# Patient Record
Sex: Female | Born: 1955 | Race: White | Hispanic: No | Marital: Married | Smoking: Former smoker
Health system: Southern US, Community
[De-identification: ages and names within clinical notes are randomized; demographics above are authoritative.]

## PROBLEM LIST (undated history)

## (undated) DIAGNOSIS — I10 Essential (primary) hypertension: Secondary | ICD-10-CM

## (undated) DIAGNOSIS — E119 Type 2 diabetes mellitus without complications: Secondary | ICD-10-CM

## (undated) HISTORY — PX: VASCULAR SURGERY: SHX849

## (undated) HISTORY — PX: MENISCUS REPAIR: SHX5179

## (undated) HISTORY — PX: CARDIAC SURGERY: SHX584

## (undated) HISTORY — PX: KNEE SURGERY: SHX244

---

## 2015-12-02 ENCOUNTER — Encounter: Payer: Self-pay | Admitting: Emergency Medicine

## 2015-12-02 ENCOUNTER — Emergency Department
Admission: EM | Admit: 2015-12-02 | Discharge: 2015-12-02 | Disposition: A | Discharge status: Home / Self Care | Payer: Self-pay | Attending: Emergency Medicine | Admitting: Emergency Medicine

## 2015-12-02 DIAGNOSIS — I10 Essential (primary) hypertension: Secondary | ICD-10-CM | POA: Insufficient documentation

## 2015-12-02 DIAGNOSIS — I471 Supraventricular tachycardia: Secondary | ICD-10-CM | POA: Insufficient documentation

## 2015-12-02 DIAGNOSIS — F17200 Nicotine dependence, unspecified, uncomplicated: Secondary | ICD-10-CM | POA: Insufficient documentation

## 2015-12-02 DIAGNOSIS — E119 Type 2 diabetes mellitus without complications: Secondary | ICD-10-CM | POA: Insufficient documentation

## 2015-12-02 HISTORY — DX: Essential (primary) hypertension: I10

## 2015-12-02 HISTORY — DX: Type 2 diabetes mellitus without complications: E11.9

## 2015-12-02 LAB — CBC WITH DIFFERENTIAL/PLATELET
BASOS ABS: 0 10*3/uL (ref 0–0.1)
Basophils Relative: 0 %
EOS ABS: 0.1 10*3/uL (ref 0–0.7)
EOS PCT: 1 %
HCT: 40.5 % (ref 35.0–47.0)
HEMOGLOBIN: 13.6 g/dL (ref 12.0–16.0)
LYMPHS ABS: 1.9 10*3/uL (ref 1.0–3.6)
LYMPHS PCT: 22 %
MCH: 26.3 pg (ref 26.0–34.0)
MCHC: 33.5 g/dL (ref 32.0–36.0)
MCV: 78.5 fL — AB (ref 80.0–100.0)
Monocytes Absolute: 0.6 10*3/uL (ref 0.2–0.9)
Monocytes Relative: 6 %
NEUTROS PCT: 71 %
Neutro Abs: 6.1 10*3/uL (ref 1.4–6.5)
PLATELETS: 327 10*3/uL (ref 150–440)
RBC: 5.15 MIL/uL (ref 3.80–5.20)
RDW: 15.9 % — ABNORMAL HIGH (ref 11.5–14.5)
WBC: 8.8 10*3/uL (ref 3.6–11.0)

## 2015-12-02 LAB — MAGNESIUM: MAGNESIUM: 1.5 mg/dL — AB (ref 1.7–2.4)

## 2015-12-02 LAB — BASIC METABOLIC PANEL
Anion gap: 13 (ref 5–15)
BUN: 9 mg/dL (ref 6–20)
CHLORIDE: 104 mmol/L (ref 101–111)
CO2: 24 mmol/L (ref 22–32)
Calcium: 9.5 mg/dL (ref 8.9–10.3)
Creatinine, Ser: 0.52 mg/dL (ref 0.44–1.00)
GFR calc Af Amer: >60 mL/min (ref >60)
Glucose, Bld: 201 mg/dL — ABNORMAL HIGH (ref 65–99)
POTASSIUM: 3.6 mmol/L (ref 3.5–5.1)
Sodium: 141 mmol/L (ref 135–145)

## 2015-12-02 MED ORDER — MAGNESIUM 400 MG PO TABS: 1.0000 | ORAL_TABLET | Freq: Three times a day (TID) | ORAL | Status: AC

## 2015-12-02 MED ORDER — SODIUM CHLORIDE 0.9 % IV BOLUS (SEPSIS): 1000.0000 mL | Freq: Once | INTRAVENOUS | Status: DC

## 2015-12-02 MED ORDER — MAGNESIUM SULFATE 2 GM/50ML IV SOLN
2.0000 g | Freq: Once | INTRAVENOUS | Status: AC
  Administered 2015-12-02: 2 g via INTRAVENOUS
  Filled 2015-12-02: qty 50

## 2015-12-02 NOTE — ED Notes (Signed)
AAOx3.  Skin warm and dry. NAD.  Ambulates with easy and steady gait.   

## 2015-12-02 NOTE — Discharge Instructions (Signed)
Paroxysmal Supraventricular Tachycardia Paroxysmal supraventricular tachycardia (PSVT) is a type of abnormal heart rhythm. It causes your heart to beat very quickly and then suddenly stop beating so quickly. A normal heart rate is 60-100 beats per minute. During an episode of PSVT, your heart rate may be 150-250 beats per minute. This can make you feel light-headed and short of breath. An episode of PSVT can be frightening. It is usually not dangerous. The heart has four chambers. All chambers need to work together for the heart to beat effectively. A normal heartbeat usually starts in the right upper chamber of the heart (atrium) when an area (sinoatrial node) puts out an electrical signal that spreads to the other chambers. People with PSVT may have abnormal electrical pathways, or they may have other areas in the upper chambers that send out electrical signals. The result is a very rapid heartbeat. When your heart beats very quickly, it does not have time to fill completely with blood. When PSVT happens often or it lasts for long periods, it can lead to heart weakness and failure. Most people with PSVT do not have any other heart disease. CAUSES Abnormal electrical activity in the heart causes PSVT. It is not known why some people get PSVT and others do not. RISK FACTORS You may be more likely to have PSVT if:  You are 20-30 years old.  You are a woman. Other factors that may increase your chances of an attack include:  Stress.  Being tired.  Smoking.  Stimulant drugs.  Alcoholic drinks.  Caffeine.  Pregnancy. SIGNS AND SYMPTOMS A mild episode of PSVT may cause no symptoms. If you do have signs and symptoms, they may include:  A pounding heart.  Feeling of skipped heartbeats (palpitations).  Weakness.  Shortness of breath.  Tightness or pain in your chest.  Light-headedness.  Anxiety.  Dizziness.  Sweating.  Nausea.  A fainting spell. DIAGNOSIS Your health care  provider may suspect PSVT if you have symptoms that come and go. The health care provider will do a physical exam. If you are having an episode during the exam, the health care provider may be able to diagnose PSVT by listening to your heart and feeling your pulse. Tests may also be done, including:  An electrical study of your heart (electrocardiogram, or ECG).  A test in which you wear a portable ECG monitor all day (Holter monitor) or for several days (event monitor).  A test that involves taking an image of your heart using sound waves (echocardiogram) to rule out other causes of a fast heart rate. TREATMENT You may not need treatment if episodes of PSVT do not happen often or if they do not cause symptoms. If PSVT episodes do cause symptoms, your health care provider may first suggest trying a self-treatment called vagus nerve stimulation. The vagus nerve extends down from the brain. It regulates certain body functions. Stimulating this nerve can slow down the heart. Your health care provider can teach you ways to do this. You may need to try a few ways to find what works best for you. Options include:  Holding your breath and pushing, as though you are having a bowel movement.  Massaging an area on one side of your neck below your jaw.  Bending forward with your head between your legs.  Bending forward with your head between your legs and coughing.  Massaging your eyeballs with your eyes closed. If vagus nerve stimulation does not work, other treatment options include:    Medicines to prevent an attack.  Being treated in the hospital with medicine or electric shock to stop an attack (cardioversion). This treatment can include:  Getting medicine through an IV line.  Having a small electric shock delivered to your heart. You will be given medicine to make you sleep through this procedure.  If you have frequent episodes with symptoms, you may need a procedure to get rid of the faulty  areas of your heart (radiofrequency ablation) and end the episodes of PSVT. In this procedure:  A long, thin tube (catheter) is passed through one of your veins into your heart.  Energy directed through the catheter eliminates the areas of your heart that are causing abnormal electric stimulation. HOME CARE INSTRUCTIONS  Take medicines only as directed by your health care provider.  Do not use caffeine in any form if caffeine triggers episodes of PSVT. Otherwise, consume caffeine in moderation. This means no more than a few cups of coffee or the equivalent each day.  Do not drink alcohol if alcohol triggers episodes of PSVT. Otherwise, limit alcohol intake to no more than 1 drink per day for nonpregnant women and 2 drinks per day for men. One drink equals 12 ounces of beer, 5 ounces of wine, or 1 ounces of hard liquor.  Do not use any tobacco products, including cigarettes, chewing tobacco, or electronic cigarettes. If you need help quitting, ask your health care provider.  Try to get at least 7 hours of sleep each night.  Find healthy ways to manage stress.  Perform vagus nerve stimulation as directed by your health care provider.  Maintain a healthy weight.  Get some exercise on most days. Ask your health care provider to suggest some good activities for you. SEEK MEDICAL CARE IF:  You are having episodes of PSVT more often, or they are lasting longer.  Vagus nerve stimulation is no longer helping.  You have new symptoms during an episode. SEEK IMMEDIATE MEDICAL CARE IF:  You have chest pain or trouble breathing.  You have an episode of PSVT that has lasted longer than 20 minutes.  You have passed out from an episode of PSVT. These symptoms may represent a serious problem that is an emergency. Do not wait to see if the symptoms will go away. Get medical help right away. Call your local emergency services (911 in the [place] Do not drive yourself to the hospital.   This  information is not intended to replace advice given to you by your health care provider. Make sure you discuss any questions you have with your health care provider.   Document Released: 06/08/2005 Document Revised: 06/29/2014 Document Reviewed: 11/16/2013 Elsevier Interactive Patient Education 2016 [employer]  

## 2015-12-02 NOTE — ED Notes (Signed)
Pt via ems with tachycardia. Pt states that she has had previous episodes of svt x 4 and that she always converts with "a shot." Pt is here from out of town and will see her cardiologist when she returns. Last episode of SVT was 6 months ago. Pt alert & oriented with NAD noted, asking to go home.

## 2015-12-02 NOTE — ED Provider Notes (Signed)
[facility] Emergency Department Provider Note  ____________________________________________  Time seen: 10:35 AM  I have reviewed the triage vital signs and the nursing notes.   HISTORY  Chief Complaint Tachycardia    HPI Victoria Potts is a 60 y.o. female brought to the ED for her tachycardia. EMS found that she was in SVT which has happened to her multiple times before. They gave her 6 mg of adenosine which immediately converted her to a sinus tachycardia with a rate of about 110. During the SVT with a heart rate of 180 she had felt some mild shortness of breath which resolved immediately upon comparing the sinus tachycardia. Denies any exertional symptoms. Onset was while folding clothes this morning. [place] to [place] and actually flying home to [place] this evening. She plans to see her cardiologist there this week given today's episode. She takes diltiazem daily which she took this morning as usual. She's not been missing any doses. No recent illness or fevers. She currently feels completely fine and actually requests to be discharged and is resistant to workup.     Past Medical History  Diagnosis Date  . Hypertension   . Diabetes mellitus without complication (HCC)      There are no active problems to display for this patient.    Past Surgical History  Procedure Laterality Date  . Meniscus repair    . Vascular surgery    . Knee surgery       Current Outpatient Rx  Name  Route  Sig  Dispense  Refill  . Magnesium 400 MG TABS   Oral   Take 1 tablet by mouth 3 (three) times daily.   30 tablet   0      Allergies Review of patient's allergies indicates no known allergies.   No family history on file.  Social History Social History  Substance Use Topics  . Smoking status: Current Every Day Smoker  . Smokeless tobacco: None  . Alcohol Use: No     Comment: occasional    Review of Systems  Constitutional:   No  fever or chills.  Eyes:   No vision changes.  ENT:   No sore throat. No rhinorrhea. Cardiovascular:   No chest pain.Positive heart racing Respiratory:   Mild dyspnea, now resolved. No cough. Gastrointestinal:   Negative for abdominal pain, vomiting and diarrhea.  Genitourinary:   Negative for dysuria or difficulty urinating. Musculoskeletal:   Negative for focal pain or swelling Neurological:   Negative for headaches 10-point ROS otherwise negative.  ____________________________________________   PHYSICAL EXAM:  VITAL SIGNS: ED Triage Vitals  Enc Vitals Group     BP 12/02/15 1041 142/83 mmHg     Pulse Rate 12/02/15 1041 109     Resp 12/02/15 1041 23     Temp 12/02/15 1041 98.3 F (36.8 C)     Temp Source 12/02/15 1041 Oral     SpO2 12/02/15 1041 97 %     Weight 12/02/15 1041 170 lb (77.111 kg)     Height 12/02/15 1041 5\' 1"  (1.549 m)     Head Cir --      Peak Flow --      Pain Score 12/02/15 1122 0     Pain Loc --      Pain Edu? --      Excl. in GC? --     Vital signs reviewed, nursing assessments reviewed.   Constitutional:   Alert and oriented. Well appearing and  in no distress. Eyes:   No scleral icterus. No conjunctival pallor. PERRL. EOMI.  No nystagmus. ENT   Head:   Normocephalic and atraumatic.   Nose:   No congestion/rhinnorhea. No septal hematoma   Mouth/Throat:   MMM, no pharyngeal erythema. No peritonsillar mass.    Neck:   No stridor. No SubQ emphysema. No meningismus. Hematological/Lymphatic/Immunilogical:   No cervical lymphadenopathy. Cardiovascular:   Tachycardia heart rate 105. Symmetric bilateral radial and DP pulses.  No murmurs.  Respiratory:   Normal respiratory effort without tachypnea nor retractions. Breath sounds are clear and equal bilaterally. No wheezes/rales/rhonchi. Gastrointestinal:   Soft and nontender. Non distended. There is no CVA tenderness.  No rebound, rigidity, or guarding. Genitourinary:    deferred Musculoskeletal:   Nontender with normal range of motion in all extremities. No joint effusions.  No lower extremity tenderness.  No edema. Neurologic:   Normal speech and language.  CN 2-10 normal. Motor grossly intact. No gross focal neurologic deficits are appreciated.  Skin:    Skin is warm, dry and intact. No rash noted.  No petechiae, purpura, or bullae.  ____________________________________________    LABS (pertinent positives/negatives) (all labs ordered are listed, but only abnormal results are displayed) Labs Reviewed  BASIC METABOLIC PANEL - Abnormal; Notable for the following:    Glucose, Bld 201 (*)    All other components within normal limits  CBC WITH DIFFERENTIAL/PLATELET - Abnormal; Notable for the following:    MCV 78.5 (*)    RDW 15.9 (*)    All other components within normal limits  MAGNESIUM - Abnormal; Notable for the following:    Magnesium 1.5 (*)    All other components within normal limits   ____________________________________________   EKG  Interpreted by me Sinus tachycardia rate 106, normal axis and intervals. Poor R-wave progression in anterior [place] for normal ST segments and T waves.  Rhythm strip performed by EMS at 10:13 AM consistent with SVT with a rate of 179.  ____________________________________________    RADIOLOGY    ____________________________________________   PROCEDURES   ____________________________________________   INITIAL IMPRESSION / ASSESSMENT AND PLAN / ED COURSE  Pertinent labs & imaging results that were available during my care of the patient were reviewed by me and considered in my medical decision making (see chart for details).  Patient presents with episode of paroxysmal SVT. She's had this before. She takes diltiazem to help prevent it. She denies any excessive stimulant use or sleep deprivation recent sickness or excess stress. On labs she is found to be slightly hypomagnesemic at 1.5 which  may have contributed. I'll start her on magnesium supplements after giving a magnesium IV bolus. 2 also gave her IV fluids which has improved her tachycardia further. She is very well-appearing. She refuses to stay any longer, and she does appear to be medically stable. She'll follow up with her established cardiologist very soon. We'll discharge home at 11:30 AM. Low suspicion for ACS TAD or dissection. No evidence of pneumonia or sepsis.     ____________________________________________   FINAL CLINICAL IMPRESSION(S) / ED DIAGNOSES  Final diagnoses:  Paroxysmal SVT (supraventricular tachycardia) (HCC)       Portions of this note were generated with dragon dictation software. Dictation errors may occur despite best attempts at proofreading.   Sharman Cheek, MD 12/02/15 1131

## 2020-10-18 ENCOUNTER — Emergency Department: Payer: Medicare HMO

## 2020-10-18 ENCOUNTER — Emergency Department
Admission: EM | Admit: 2020-10-18 | Discharge: 2020-10-18 | Disposition: A | Discharge status: Home / Self Care | Payer: Medicare HMO | Attending: Student in an Organized Health Care Education/Training Program | Admitting: Student in an Organized Health Care Education/Training Program

## 2020-10-18 ENCOUNTER — Other Ambulatory Visit: Payer: Self-pay

## 2020-10-18 DIAGNOSIS — Z951 Presence of aortocoronary bypass graft: Secondary | ICD-10-CM | POA: Diagnosis not present

## 2020-10-18 DIAGNOSIS — R790 Abnormal level of blood mineral: Secondary | ICD-10-CM | POA: Insufficient documentation

## 2020-10-18 DIAGNOSIS — Z87891 Personal history of nicotine dependence: Secondary | ICD-10-CM | POA: Insufficient documentation

## 2020-10-18 DIAGNOSIS — I10 Essential (primary) hypertension: Secondary | ICD-10-CM | POA: Insufficient documentation

## 2020-10-18 DIAGNOSIS — R002 Palpitations: Secondary | ICD-10-CM | POA: Insufficient documentation

## 2020-10-18 DIAGNOSIS — R000 Tachycardia, unspecified: Secondary | ICD-10-CM | POA: Diagnosis not present

## 2020-10-18 DIAGNOSIS — R0602 Shortness of breath: Secondary | ICD-10-CM | POA: Diagnosis not present

## 2020-10-18 DIAGNOSIS — E119 Type 2 diabetes mellitus without complications: Secondary | ICD-10-CM | POA: Diagnosis not present

## 2020-10-18 DIAGNOSIS — Z20822 Contact with and (suspected) exposure to covid-19: Secondary | ICD-10-CM | POA: Diagnosis not present

## 2020-10-18 DIAGNOSIS — R7989 Other specified abnormal findings of blood chemistry: Secondary | ICD-10-CM

## 2020-10-18 LAB — CBC
HCT: 39.7 % (ref 36.0–46.0)
Hemoglobin: 12.5 g/dL (ref 12.0–15.0)
MCH: 24 pg — ABNORMAL LOW (ref 26.0–34.0)
MCHC: 31.5 g/dL (ref 30.0–36.0)
MCV: 76.2 fL — ABNORMAL LOW (ref 80.0–100.0)
Platelets: 329 10*3/uL (ref 150–400)
RBC: 5.21 MIL/uL — ABNORMAL HIGH (ref 3.87–5.11)
RDW: 16 % — ABNORMAL HIGH (ref 11.5–15.5)
WBC: 11.2 10*3/uL — ABNORMAL HIGH (ref 4.0–10.5)
nRBC: 0 % (ref 0.0–0.2)

## 2020-10-18 LAB — BASIC METABOLIC PANEL
Anion gap: 12 (ref 5–15)
BUN: 11 mg/dL (ref 8–23)
CO2: 23 mmol/L (ref 22–32)
Calcium: 9.5 mg/dL (ref 8.9–10.3)
Chloride: 104 mmol/L (ref 98–111)
Creatinine, Ser: 0.57 mg/dL (ref 0.44–1.00)
GFR, Estimated: >60 mL/min (ref >60)
Glucose, Bld: 183 mg/dL — ABNORMAL HIGH (ref 70–99)
Potassium: 3.8 mmol/L (ref 3.5–5.1)
Sodium: 139 mmol/L (ref 135–145)

## 2020-10-18 LAB — RESP PANEL BY RT-PCR (FLU A&B, COVID) ARPGX2
Influenza A by PCR: NEGATIVE
Influenza B by PCR: NEGATIVE
SARS Coronavirus 2 by RT PCR: NEGATIVE

## 2020-10-18 LAB — BRAIN NATRIURETIC PEPTIDE: B Natriuretic Peptide: 272.9 pg/mL — ABNORMAL HIGH (ref 0.0–100.0)

## 2020-10-18 LAB — TROPONIN I (HIGH SENSITIVITY)
Troponin I (High Sensitivity): 8 ng/L (ref <18)
Troponin I (High Sensitivity): 9 ng/L (ref <18)

## 2020-10-18 LAB — PROTIME-INR
INR: 1 (ref 0.8–1.2)
Prothrombin Time: 12.7 seconds (ref 11.4–15.2)

## 2020-10-18 LAB — D-DIMER, QUANTITATIVE: D-Dimer, Quant: 0.78 ug/mL-FEU — ABNORMAL HIGH (ref 0.00–0.50)

## 2020-10-18 MED ORDER — LORAZEPAM 2 MG/ML IJ SOLN
0.5000 mg | Freq: Once | INTRAMUSCULAR | Status: AC
  Administered 2020-10-18: 0.5 mg via INTRAVENOUS
  Filled 2020-10-18: qty 1

## 2020-10-18 MED ORDER — TECHNETIUM TO 99M ALBUMIN AGGREGATED
4.4400 | Freq: Once | INTRAVENOUS | Status: AC | PRN
  Administered 2020-10-18: 4.44 via INTRAVENOUS

## 2020-10-18 MED ORDER — IOHEXOL 350 MG/ML SOLN: 75.0000 mL | Freq: Once | INTRAVENOUS | Status: DC | PRN

## 2020-10-18 MED ORDER — FUROSEMIDE 20 MG PO TABS: 20.0000 mg | ORAL_TABLET | Freq: Every day | ORAL | 0 refills | Status: AC

## 2020-10-18 NOTE — ED Provider Notes (Signed)
[facility] Emergency Department Provider Note  ____________________________________________   Event Date/Time   First MD Initiated Contact with Patient 10/18/20 1157     (approximate)  I have reviewed the triage vital signs and the nursing notes.   HISTORY  Chief Complaint Palpitations  HPI Victoria Potts is a 65 y.o. female who reports to the emergency department today for evaluation of heart palpitation.  Patient's past medical history significant for three-vessel CABG approximately 8 months ago in [place].  She and her husband recently moved back here and she has been followed by [facility] cardiology since that time.  She reports that last night she did not sleep well and felt very restless with some mild increase in her shortness of breath and this morning began having heart palpitations.  She states that she had palpitations prior to her CABG and had treatment several times reentrant tachycardia, however she has not had any of these episodes since her CABG procedure.  She denies any history of CHF or known MI.  She states that both she and her husband had viral URI symptoms over the last 3 weeks that they believe that they contracted from her 29-year-old grandson, however both felt that they had been improving and no longer had a present cough or other symptoms.  She denies any associated chest pain with these events.        Past Medical History:  Diagnosis Date  . Diabetes mellitus without complication (HCC)   . Hypertension     There are no problems to display for this patient.   Past Surgical History:  Procedure Laterality Date  . CARDIAC SURGERY    . KNEE SURGERY    . MENISCUS REPAIR    . VASCULAR SURGERY      Prior to Admission medications   Medication Sig Start Date End Date Taking? Authorizing Provider  furosemide (LASIX) 20 MG tablet Take 1 tablet (20 mg total) by mouth daily for 5 days. 10/18/20 10/23/20 Yes Paduchowski, Caryn Bee, MD  Magnesium 400  MG TABS Take 1 tablet by mouth 3 (three) times daily. 12/02/15   Sharman Cheek, MD    Allergies Patient has no known allergies.  No family history on file.  Social History Social History   Tobacco Use  . Smoking status: Former Games developer  . Smokeless tobacco: Never Used  Substance Use Topics  . Alcohol use: No    Comment: occasional  . Drug use: Not Currently    Review of Systems Constitutional: No fever/chills Eyes: No visual changes. ENT: No sore throat. Cardiovascular: + Palpitations, denies chest pain. Respiratory: + shortness of breath. Gastrointestinal: No abdominal pain.  No nausea, no vomiting.  No diarrhea.  No constipation. Genitourinary: Negative for dysuria. Musculoskeletal: Negative for back pain. Skin: Negative for rash. Neurological: Negative for headaches, focal weakness or numbness. ____________________________________________   PHYSICAL EXAM:  VITAL SIGNS: ED Triage Vitals [10/18/20 1119]  Enc Vitals Group     BP (!) 146/94     Pulse Rate (!) 105     Resp 18     Temp 98.4 F (36.9 C)     Temp Source Oral     SpO2 96 %     Weight 169 lb (76.7 kg)     Height 5\' 1"  (1.549 m)     Head Circumference      Peak Flow      Pain Score 0     Pain Loc      Pain Edu?  Excl. in GC?    Constitutional: Alert and oriented. Well appearing and in no acute distress. Eyes: Conjunctivae are normal. PERRL. EOMI. Head: Atraumatic. Nose: No congestion/rhinnorhea. Mouth/Throat: Mucous membranes are moist.  Neck: No stridor.   Cardiovascular: Mildly tachycardic, regular rhythm. Grossly normal heart sounds.  Good peripheral circulation.  No pitting edema. Respiratory: Normal respiratory effort.  No retractions. Lungs CTAB. Gastrointestinal: Soft and nontender. No distention. No abdominal bruits. No CVA tenderness. Musculoskeletal: No lower extremity tenderness nor edema.  No joint effusions. Neurologic:  Normal speech and language. No gross focal neurologic  deficits are appreciated. No gait instability. Skin:  Skin is warm, dry and intact. No rash noted. Psychiatric: Mood and affect are normal. Speech and behavior are normal.  ____________________________________________   LABS (all labs ordered are listed, but only abnormal results are displayed)  Labs Reviewed  BASIC METABOLIC PANEL - Abnormal; Notable for the following components:      Result Value   Glucose, Bld 183 (*)    All other components within normal limits  CBC - Abnormal; Notable for the following components:   WBC 11.2 (*)    RBC 5.21 (*)    MCV 76.2 (*)    MCH 24.0 (*)    RDW 16.0 (*)    All other components within normal limits  BRAIN NATRIURETIC PEPTIDE - Abnormal; Notable for the following components:   B Natriuretic Peptide 272.9 (*)    All other components within normal limits  D-DIMER, QUANTITATIVE - Abnormal; Notable for the following components:   D-Dimer, Quant 0.78 (*)    All other components within normal limits  RESP PANEL BY RT-PCR (FLU A&B, COVID) [id number]  PROTIME-INR  TROPONIN I (HIGH SENSITIVITY)  TROPONIN I (HIGH SENSITIVITY)   ____________________________________________  EKG Normal sinus rhythm with a rate of 105.  There is a left bundle branch block.  No ST elevations or depressions to suggest acute ischemia.  ____________________________________________  RADIOLOGY I, Lucy Chrisaitlin J Breanda Greenlaw, personally viewed and evaluated these images (plain radiographs) as part of my medical decision making, as well as reviewing the written report by the radiologist.  ED MD interpretation: No acute   Official radiology report(s): DG Chest 2 View  Result Date: 10/18/2020 CLINICAL DATA:  Chest pain. EXAM: CHEST - 2 VIEW COMPARISON:  None. FINDINGS: Median sternotomy with atrial clip. Cardiac silhouette is mildly enlarged. Aortic atherosclerosis. Streaky left basilar opacities. No confluent consolidation. No visible pleural effusions or pneumothorax. IMPRESSION:  1. Streaky left basilar opacities, favor atelectasis. 2. Mild cardiomegaly. Electronically Signed   By: Feliberto HartsFrederick S Jones MD   On: 10/18/2020 11:47   NM Pulmonary Perfusion  Result Date: 10/18/2020 CLINICAL DATA:  Shortness of breath EXAM: NUCLEAR MEDICINE PERFUSION LUNG SCAN TECHNIQUE: Perfusion images were obtained in multiple projections after intravenous injection of radiopharmaceutical. Views: Anterior, posterior, left lateral, right lateral, RPO, LPO, RAO, LAO RADIOPHARMACEUTICALS:  4.44 mCi Tc-7584m MAA IV COMPARISON:  Chest radiograph October 18, 2020 FINDINGS: Radiotracer uptake is homogeneous and symmetric bilaterally. No evident perfusion defects. IMPRESSION: No evident perfusion defects. No findings indicative of pulmonary embolus. Electronically Signed   By: Bretta BangWilliam  Woodruff III M.D.   On: 10/18/2020 16:09   US Venous Img Lower Bilateral  Result Date: 10/18/2020 CLINICAL DATA:  Elevated D-dimer. Shortness of breath. Evaluate for DVT. 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 EXAM: BILATERAL LOWER EXTREMITY VENOUS DOPPLER ULTRASOUND TECHNIQUE: Gray-scale sonography with graded compression, as well as color Doppler and duplex ultrasound were performed to  evaluate the lower extremity deep venous systems from the level of the common femoral vein and including the common femoral, femoral, profunda femoral, popliteal and calf veins including the posterior tibial, peroneal and gastrocnemius veins when visible. The superficial great saphenous vein was also interrogated. Spectral Doppler was utilized to evaluate flow at rest and with distal augmentation maneuvers in the common femoral, femoral and popliteal veins. COMPARISON:  None. FINDINGS: RIGHT LOWER EXTREMITY Common Femoral Vein: No evidence of thrombus. Normal compressibility, respiratory phasicity and response to augmentation. Saphenofemoral Junction: No evidence of thrombus. Normal compressibility and flow on color Doppler  imaging. Profunda Femoral Vein: No evidence of thrombus. Normal compressibility and flow on color Doppler imaging. Femoral Vein: No evidence of thrombus. Normal compressibility, respiratory phasicity and response to augmentation. Popliteal Vein: No evidence of thrombus. Normal compressibility, respiratory phasicity and response to augmentation. Calf Veins: No evidence of thrombus. Normal compressibility and flow on color Doppler imaging. Superficial Great Saphenous Vein: No evidence of thrombus. Normal compressibility. Venous Reflux:  None. Other Findings:  None. LEFT LOWER EXTREMITY Common Femoral Vein: No evidence of thrombus. Normal compressibility, respiratory phasicity and response to augmentation. Saphenofemoral Junction: No evidence of thrombus. Normal compressibility and flow on color Doppler imaging. Profunda Femoral Vein: No evidence of thrombus. Normal compressibility and flow on color Doppler imaging. Femoral Vein: No evidence of thrombus. Normal compressibility, respiratory phasicity and response to augmentation. Popliteal Vein: No evidence of thrombus. Normal compressibility, respiratory phasicity and response to augmentation. Calf Veins: No evidence of thrombus. Normal compressibility and flow on color Doppler imaging. Superficial Great Saphenous Vein: No evidence of thrombus. Normal compressibility. Venous Reflux:  None. Other Findings:  None. IMPRESSION: No evidence of DVT within either lower extremity. Electronically Signed   By: Simonne Come M.D.   On: 10/18/2020 15:20    ____________________________________________   INITIAL IMPRESSION / ASSESSMENT AND PLAN / ED COURSE  As part of my medical decision making, I reviewed the following data within the electronic MEDICAL RECORD NUMBER Nursing notes reviewed and incorporated, Labs reviewed, Radiograph reviewed, Evaluated by EM attending Dr. Roxan Hockey and Notes from prior ED visits     The entirety of this patient's care was supervised by attending  physician, begetting with Dr. Roxan Hockey who signed out to Dr. Lenard Lance.   Patient is a 65 year old female who reports to the emergency department for evaluation of shortness of breath and palpitations that have been present beginning late last night into today.  She reports she still feeling palpitations now.  She denies any chest pain, dizziness, any recent ill contacts or other complaints.  She does have significant cardiac history as described above.  In triage, the patient is mildly tachycardic but otherwise has normal vital signs.  On physical exam, the patient does not have any wheezing or other abnormal breath sounds.  He also does not have any other significant abnormal findings.  Work-up included CBC, CMP, PT/INR, respiratory panel, troponin, BNP and D-dimer.  The patient was tachycardic with a complaint of shortness of breath with cardiac history and thus there was concern for possible PE.  The patient's D-dimer was elevated.  She was unable to tolerate a CT with contrast and the last time she was pretreated had 4 weeks of elevated blood sugars due to the steroid, and thus we performed V/Q scan instead.  VQ scan nor bilateral Doppler lower extremity show any evidence of DVT or PE.  The patient's tachycardia has significantly improved and she reports she no longer feels the  palpitations.   Discussed the findings with current attending Dr. Lenard Lance. Current plan is to d/c patient to home with 5 day course of lasix and close follow up with cardiology. Patient is amenable with this plan and she's stable at this time for outpatient follow up.       ____________________________________________   FINAL CLINICAL IMPRESSION(S) / ED DIAGNOSES  Final diagnoses:  Palpitations  Elevated brain natriuretic peptide (BNP) level     ED Discharge Orders         Ordered    furosemide (LASIX) 20 MG tablet  Daily        10/18/20 1658          *Please note:  Karryn Kosinski was evaluated in  Emergency Department on 10/18/2020 for the symptoms described in the history of present illness. She was evaluated in the context of the global COVID-19 pandemic, which necessitated consideration that the patient might be at risk for infection with the SARS-CoV-2 virus that causes COVID-19. Institutional protocols and algorithms that pertain to the evaluation of patients at risk for COVID-19 are in a state of rapid change based on information released by regulatory bodies including the CDC and federal and state organizations. These policies and algorithms were followed during the patient's care in the ED.  Some ED evaluations and interventions may be delayed as a result of limited staffing during and the pandemic.*   Note:  This document was prepared using Dragon voice recognition software and may include unintentional dictation errors.   Lucy Chris, PA 10/18/20 1918    Willy Eddy, MD 10/21/20 [phone]

## 2020-10-18 NOTE — ED Notes (Signed)
Pt back from scan 

## 2020-10-18 NOTE — ED Notes (Signed)
Received call from [facility] , they will come get pt for scan

## 2020-10-18 NOTE — ED Notes (Signed)
Patient transported to Ultrasound 

## 2020-10-18 NOTE — Discharge Instructions (Addendum)
As we discussed please call your cardiologist to arrange a follow-up appointment preferably this coming week for recheck/reevaluation and possible repeat echocardiogram.  Please take your Lasix beginning tomorrow morning 10/19/2020 once daily for the next 5 days.  Return to the emergency department for any worsening shortness of breath, development of any chest pain, or any other symptom personally concerning to yourself.

## 2020-10-18 NOTE — ED Notes (Signed)
Pt calm , collective , denied pain or sob  

## 2020-10-18 NOTE — ED Triage Notes (Signed)
Pt c/o heart palpitation, skipping since last night with some SOB. Pt is in NAD at present.

## 2020-10-18 NOTE — ED Notes (Signed)
Pt taken to [facility]. 

## 2020-10-18 NOTE — ED Provider Notes (Signed)
-----------------------------------------   4:46 PM on 10/18/2020 -----------------------------------------  I have personally seen and evaluated the patient in conjunction with the physician assistant.  Patient's work-up is overall reassuring.  VQ scan shows no sign of blood clot.  Ultrasound negative.  Patient's BNP is elevated 272.  Patient states shortness of breath she has been experiencing is somewhat worse when she lies down although states she has not been sleeping very well recently possibly due to this.  Given the patient's elevated BNP and mild shortness of breath with an otherwise reassuring work-up I believe it would be beneficial to try Lasix 20 mg for the next 5 days and reassess.  Patient states he has a cardiologist at [facility] that she will follow-up with.  I spoke with the patient husband about repeating echocardiogram at [facility] as well if they are able.  Discussed my typical return precautions.  Overall patient appears well I believe she is safe for discharge home.   Minna Antis, MD 10/18/20 [phone]

## 2021-10-20 IMAGING — US US EXTREM LOW VENOUS
1 series · 13 of 24 positions shown · non-contrast
Comparison: None.

CLINICAL DATA: Elevated D-dimer. Shortness of breath. Evaluate for
DVT. History of venous harvesting for triple bypass 8 months ago.



[Series 1: us venous img lower bilat (dvt) · portal-venous · 13 of 60 slices shown]
[im 1/60]
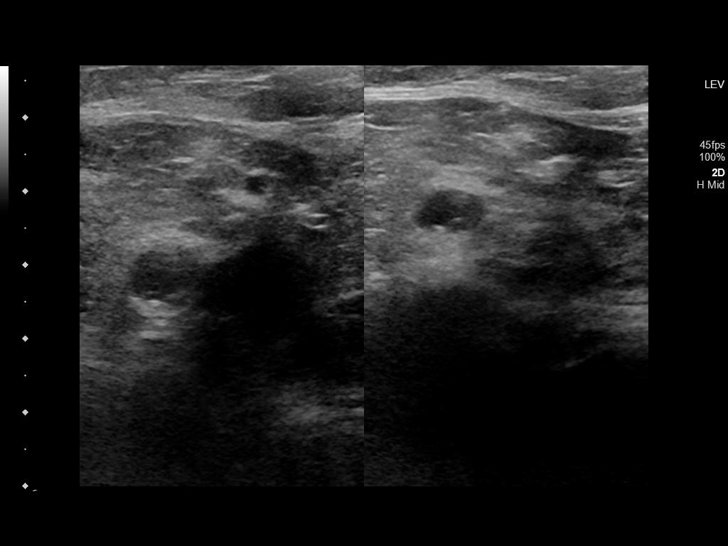
[im 6/60]
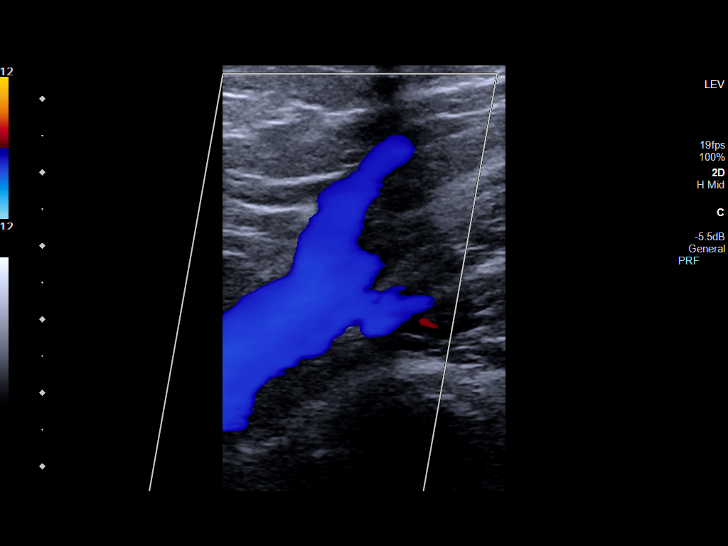
[im 11/60]
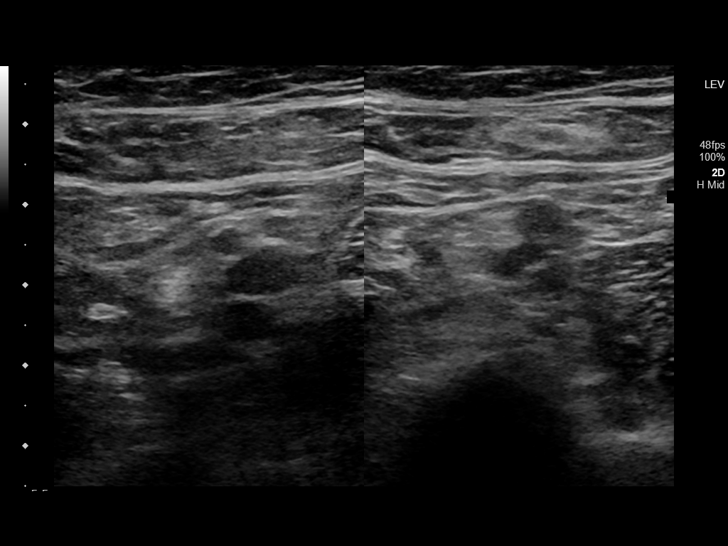
[im 16/60]
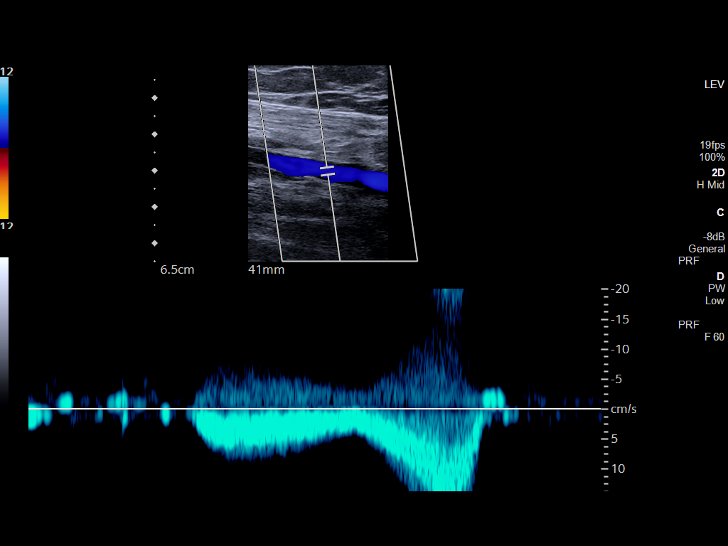
[im 21/60]
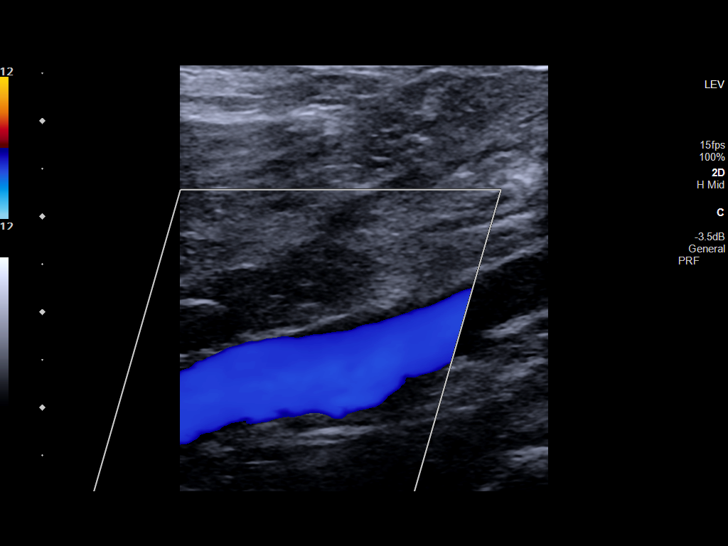
[im 26/60]
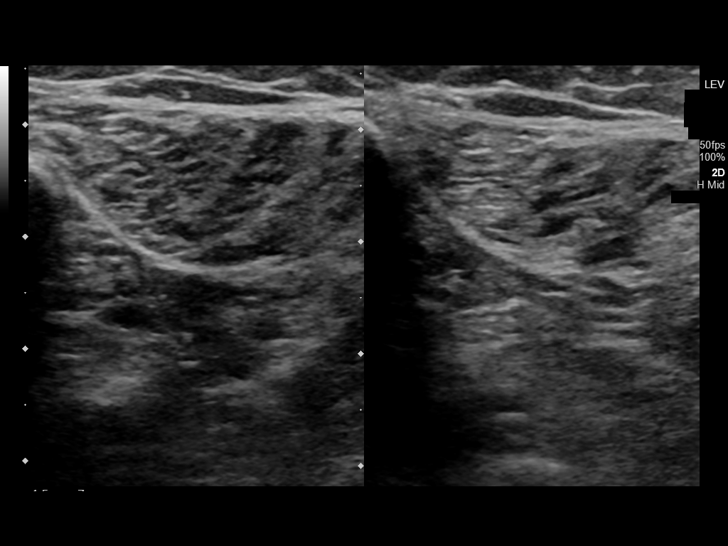
[im 31/60]
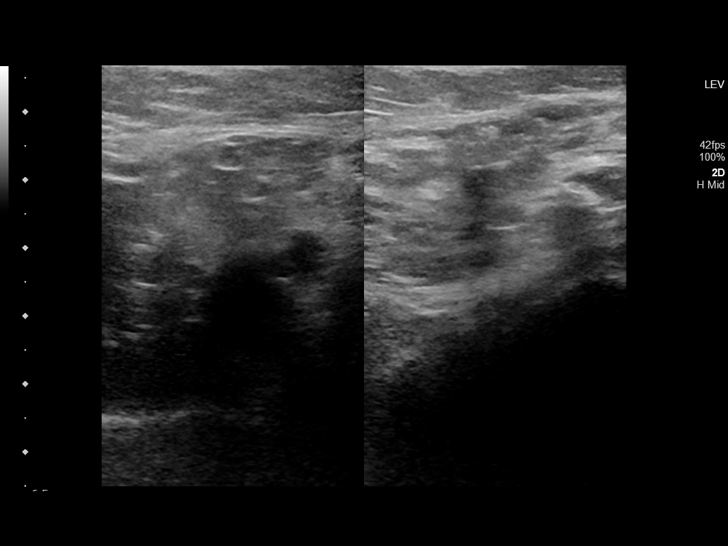
[im 34/60]
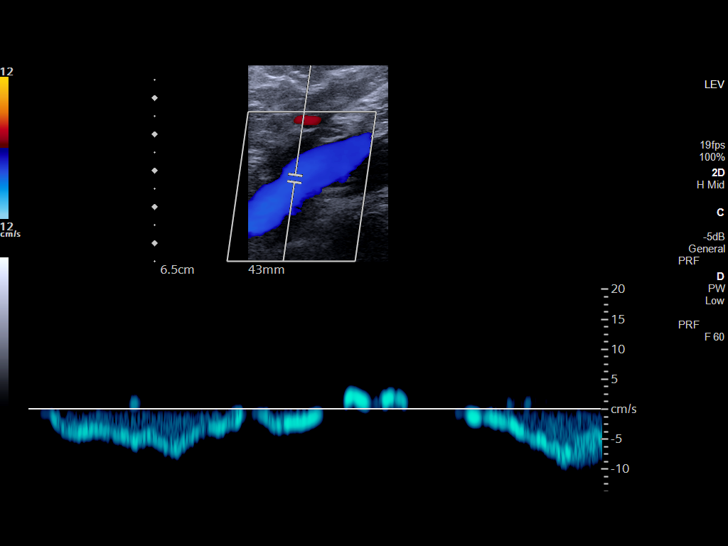
[im 39/60]
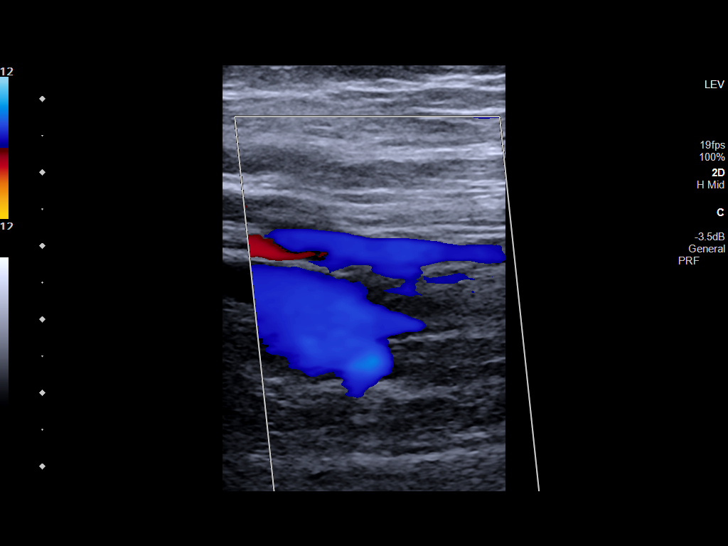
[im 44/60]
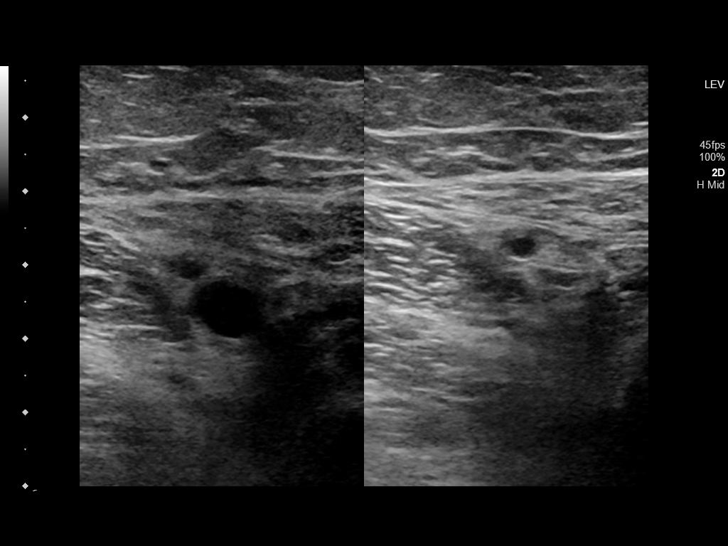
[im 49/60]
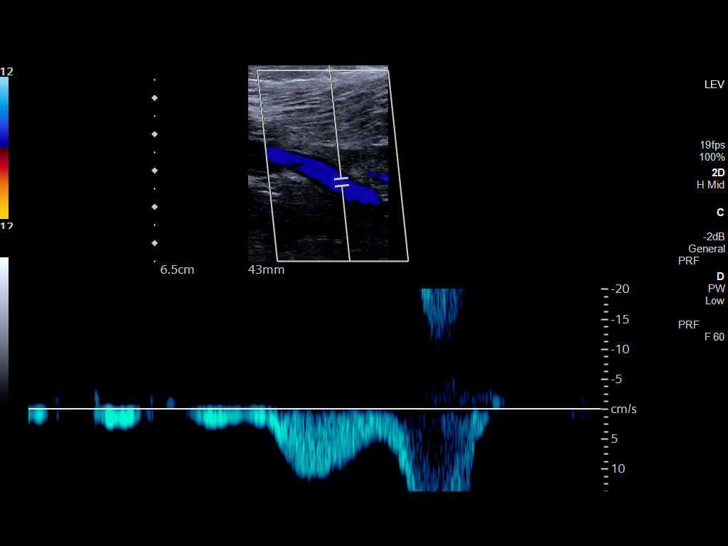
[im 54/60]
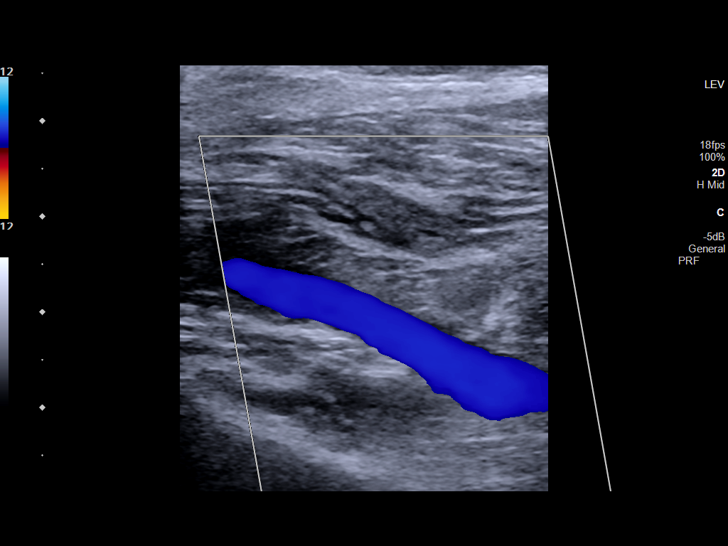
[im 60/60]
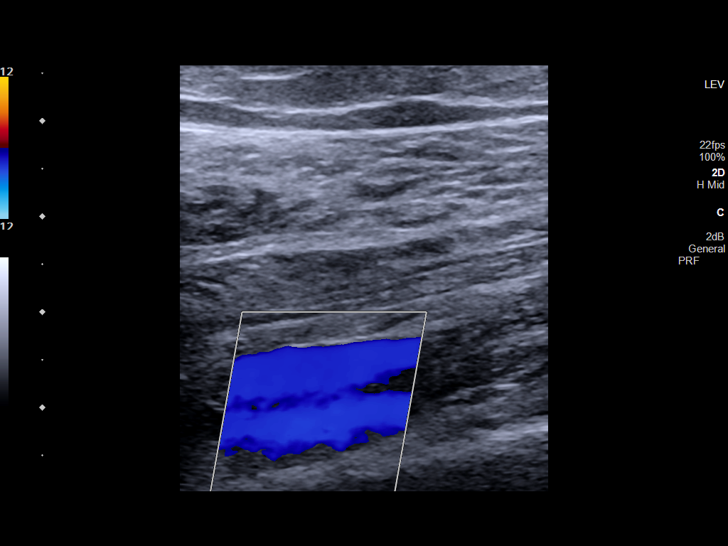

[13 of 24 positions shown; findings below may reference images not displayed]

FINDINGS: RIGHT LOWER EXTREMITY

Common Femoral Vein: No evidence of thrombus. Normal
compressibility, respiratory phasicity and response to augmentation.

Saphenofemoral Junction: No evidence of thrombus. Normal
compressibility and flow on color Doppler imaging.

Profunda Femoral Vein: No evidence of thrombus. Normal
compressibility and flow on color Doppler imaging.

Femoral Vein: No evidence of thrombus. Normal compressibility,
respiratory phasicity and response to augmentation.

Popliteal Vein: No evidence of thrombus. Normal compressibility,
respiratory phasicity and response to augmentation.

Calf Veins: No evidence of thrombus. Normal compressibility and flow
on color Doppler imaging.

Superficial Great Saphenous Vein: No evidence of thrombus. Normal
compressibility.

Venous Reflux:  None.

Other Findings:  None.

LEFT LOWER EXTREMITY

Common Femoral Vein: No evidence of thrombus. Normal
compressibility, respiratory phasicity and response to augmentation.

Saphenofemoral Junction: No evidence of thrombus. Normal
compressibility and flow on color Doppler imaging.

Profunda Femoral Vein: No evidence of thrombus. Normal
compressibility and flow on color Doppler imaging.

Femoral Vein: No evidence of thrombus. Normal compressibility,
respiratory phasicity and response to augmentation.

Popliteal Vein: No evidence of thrombus. Normal compressibility,
respiratory phasicity and response to augmentation.

Calf Veins: No evidence of thrombus. Normal compressibility and flow
on color Doppler imaging.

Superficial Great Saphenous Vein: No evidence of thrombus. Normal
compressibility.

Venous Reflux:  None.

Other Findings:  None.
IMPRESSION: No evidence of DVT within either lower extremity.

## 2021-10-20 IMAGING — NM NM PULMONARY PERF PARTICULATE
1 series · 8 of 8 positions shown · non-contrast
Comparison: Chest radiograph October 18, 2020

CLINICAL DATA: Shortness of breath

EXAM:
NUCLEAR MEDICINE PERFUSION LUNG SCAN
TECHNIQUE: Perfusion images were obtained in multiple projections after
intravenous injection of radiopharmaceutical.
Views: Anterior, posterior, left lateral, right lateral, RPO, LPO,
RAO, LAO
RADIOPHARMACEUTICALS:  4.44 mCi Fc-UUm MAA IV

[Series 1000: lung perfusion · 1.65mm/px · 4 acquisitions, 8 frames shown]
[im 1/4]
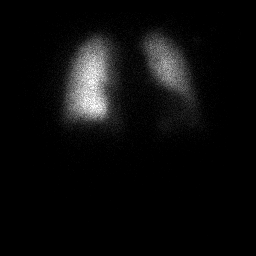
[im 1/4]
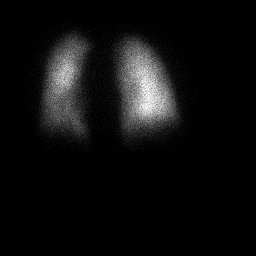
[im 2/4]
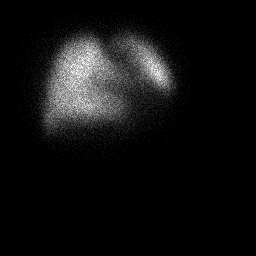
[im 2/4]
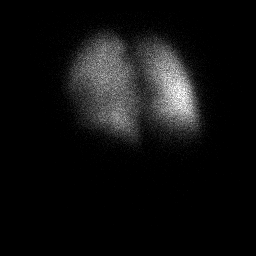
[im 3/4]
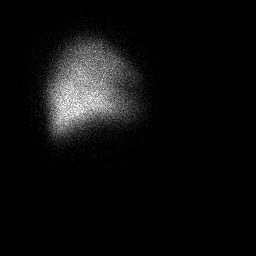
[im 3/4]
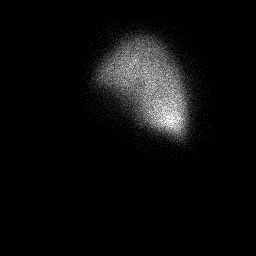
[im 4/4]
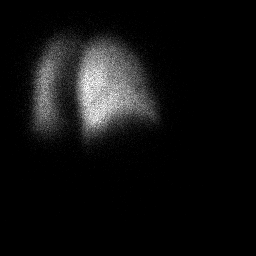
[im 4/4]
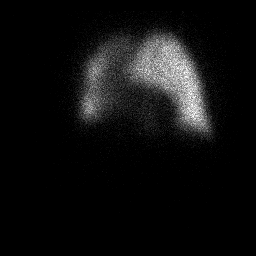

[8 of 8 positions shown; findings below may reference images not displayed]

FINDINGS: Radiotracer uptake is homogeneous and symmetric bilaterally. No
evident perfusion defects.
IMPRESSION: No evident perfusion defects. No findings indicative of pulmonary
embolus.
